# Patient Record
Sex: Female | Born: 1975 | Race: White | Hispanic: Yes | Marital: Married | Smoking: Never smoker
Health system: Southern US, Community
[De-identification: ages and names within clinical notes are randomized; demographics above are authoritative.]

---

## 2005-04-25 ENCOUNTER — Ambulatory Visit: Payer: Self-pay | Admitting: Obstetrics and Gynecology

## 2005-05-21 ENCOUNTER — Emergency Department: Payer: Self-pay | Admitting: Emergency Medicine

## 2005-06-12 ENCOUNTER — Emergency Department: Payer: Self-pay | Admitting: Emergency Medicine

## 2007-02-04 ENCOUNTER — Ambulatory Visit: Payer: Self-pay | Admitting: Family Medicine

## 2007-02-10 ENCOUNTER — Encounter: Payer: Self-pay | Admitting: Family Medicine

## 2008-04-10 ENCOUNTER — Observation Stay: Payer: Self-pay | Admitting: Vascular Surgery

## 2008-10-25 ENCOUNTER — Encounter: Payer: Self-pay | Admitting: Obstetrics and Gynecology

## 2008-11-29 ENCOUNTER — Encounter: Payer: Self-pay | Admitting: Maternal & Fetal Medicine

## 2009-01-06 ENCOUNTER — Encounter: Payer: Self-pay | Admitting: Obstetrics and Gynecology

## 2009-02-28 ENCOUNTER — Encounter: Payer: Self-pay | Admitting: Obstetrics and Gynecology

## 2009-06-18 ENCOUNTER — Emergency Department: Payer: Self-pay | Admitting: Emergency Medicine

## 2009-12-06 ENCOUNTER — Ambulatory Visit: Payer: Self-pay | Admitting: Family Medicine

## 2013-05-12 DIAGNOSIS — K219 Gastro-esophageal reflux disease without esophagitis: Secondary | ICD-10-CM | POA: Insufficient documentation

## 2013-07-04 DIAGNOSIS — J302 Other seasonal allergic rhinitis: Secondary | ICD-10-CM | POA: Insufficient documentation

## 2018-03-10 DIAGNOSIS — Z23 Encounter for immunization: Secondary | ICD-10-CM | POA: Diagnosis not present

## 2018-05-11 DIAGNOSIS — H66001 Acute suppurative otitis media without spontaneous rupture of ear drum, right ear: Secondary | ICD-10-CM | POA: Diagnosis not present

## 2018-05-27 DIAGNOSIS — K649 Unspecified hemorrhoids: Secondary | ICD-10-CM | POA: Diagnosis not present

## 2018-06-30 DIAGNOSIS — M791 Myalgia, unspecified site: Secondary | ICD-10-CM | POA: Diagnosis not present

## 2018-07-09 DIAGNOSIS — R1011 Right upper quadrant pain: Secondary | ICD-10-CM | POA: Diagnosis not present

## 2018-07-09 DIAGNOSIS — Z3200 Encounter for pregnancy test, result unknown: Secondary | ICD-10-CM | POA: Diagnosis not present

## 2018-07-22 ENCOUNTER — Other Ambulatory Visit: Payer: Self-pay | Admitting: Obstetrics and Gynecology

## 2018-07-22 DIAGNOSIS — R1011 Right upper quadrant pain: Secondary | ICD-10-CM

## 2018-07-29 ENCOUNTER — Ambulatory Visit: Payer: 59

## 2018-08-31 DIAGNOSIS — J309 Allergic rhinitis, unspecified: Secondary | ICD-10-CM | POA: Diagnosis not present

## 2018-09-29 DIAGNOSIS — K219 Gastro-esophageal reflux disease without esophagitis: Secondary | ICD-10-CM | POA: Diagnosis not present

## 2018-10-01 DIAGNOSIS — R112 Nausea with vomiting, unspecified: Secondary | ICD-10-CM | POA: Diagnosis not present

## 2018-10-01 DIAGNOSIS — R109 Unspecified abdominal pain: Secondary | ICD-10-CM | POA: Diagnosis not present

## 2018-10-24 DIAGNOSIS — R1032 Left lower quadrant pain: Secondary | ICD-10-CM | POA: Diagnosis not present

## 2018-10-24 DIAGNOSIS — R6883 Chills (without fever): Secondary | ICD-10-CM | POA: Diagnosis not present

## 2018-10-24 DIAGNOSIS — R110 Nausea: Secondary | ICD-10-CM | POA: Diagnosis not present

## 2018-10-24 DIAGNOSIS — R10814 Left lower quadrant abdominal tenderness: Secondary | ICD-10-CM | POA: Diagnosis not present

## 2018-10-24 DIAGNOSIS — K5730 Diverticulosis of large intestine without perforation or abscess without bleeding: Secondary | ICD-10-CM | POA: Diagnosis not present

## 2018-10-25 ENCOUNTER — Encounter: Payer: Self-pay | Admitting: *Deleted

## 2018-11-17 ENCOUNTER — Ambulatory Visit: Payer: 59 | Admitting: Gastroenterology

## 2018-12-27 ENCOUNTER — Other Ambulatory Visit: Payer: Self-pay

## 2018-12-27 ENCOUNTER — Ambulatory Visit: Payer: 59 | Admitting: Gastroenterology

## 2019-02-14 ENCOUNTER — Encounter: Payer: Self-pay | Admitting: Gastroenterology

## 2019-02-14 ENCOUNTER — Ambulatory Visit (INDEPENDENT_AMBULATORY_CARE_PROVIDER_SITE_OTHER): Payer: Self-pay | Admitting: Gastroenterology

## 2019-02-14 ENCOUNTER — Other Ambulatory Visit: Payer: Self-pay

## 2019-02-14 ENCOUNTER — Encounter (INDEPENDENT_AMBULATORY_CARE_PROVIDER_SITE_OTHER): Payer: Self-pay

## 2019-02-14 VITALS — BP 120/80 | HR 70 | Temp 98.7°F | Resp 17 | Wt 112.0 lb

## 2019-02-14 DIAGNOSIS — K5909 Other constipation: Secondary | ICD-10-CM

## 2019-02-14 DIAGNOSIS — R1013 Epigastric pain: Secondary | ICD-10-CM

## 2019-02-14 DIAGNOSIS — G8929 Other chronic pain: Secondary | ICD-10-CM

## 2019-02-14 NOTE — Patient Instructions (Signed)
Dieta rica en fibra High-Fiber Diet La fibra, tambin llamada fibra dietaria, es un tipo de carbohidrato que se encuentra en las frutas, las verduras, los cereales integrales y los frijoles. Una dieta rica en fibra puede tener muchos beneficios para la salud. El mdico puede recomendar una dieta rica en fibra para ayudar a:  Evitar el estreimiento. La fibra puede hacer que defeque con ms frecuencia.  Disminuir el nivel de colesterol.  Aliviar las siguientes afecciones: ? Hinchazn de la venas en el ano (hemorroides). ? Hinchazn e irritacin (inflamacin) de zonas especficas del tracto digestivo (diverticulitis sin complicaciones). ? Un problema del intestino grueso (colon) que, a veces, causa dolor y diarrea (sndrome de colon irritable, SCI).  Evitar comer en exceso como parte de un plan para bajar de peso.  Evitar la enfermedad cardaca, la diabetes tipo 2 y ciertos cnceres. En qu consiste el plan? El consumo diario recomendado de fibra en gramos (g) incluye lo siguiente:  38g para hombres de 50 aos o menos.  30g para los hombres mayores de 50 aos.  25g para mujeres de 50 aos o menos.  21g para las mujeres mayores de 50 aos. Puede recibir la ingesta diaria recomendada de fibra dietaria de la siguiente manera:  Coma una variedad de frutas, verduras, granos y frijoles.  Tome un suplemento de fibra, si no es posible comer suficiente fibra en su dieta. Qu debo saber acerca de la dieta rica en fibra?  Es mejor obtener fibra directamente de los alimentos en lugar de recibirla de suplementos de fibra. No hay demasiada investigacin acerca de qu tan efectivos son los suplementos.  Verifique siempre el contenido de fibra en la etiqueta de informacin nutricional de los alimentos preenvasados. Busque alimentos que contengan 5g o ms de fibra por porcin.  Hable con un especialista en alimentacin y nutricin (nutricionista) si tiene preguntas sobre alimentos  especficos que se recomiendan o no para su afeccin mdica, especialmente si aquellos alimentos no estn detallados a continuacin.  Aumente gradualmente la cantidad de fibra que consume. Si aumenta demasiado rpido el consumo de fibra dietaria puede provocar distensin abdominal, clicos o gases.  Beba abundante agua. El agua ayuda a digerir la fibra. Cules son algunos consejos para seguir este plan?  Consuma una gran variedad de alimentos ricos en fibra.  Asegrese de que al menos la mitad de los granos que ingiere cada da sean cereales integrales.  Coma panes y cereales hechos con harina de cereales integrales en lugar de harina refinada o blanca.  Coma arroz integral, trigo burgol o mijo en lugar de arroz blanco.  Comience el da con un desayuno rico en fibra, como un cereal que contenga al menos 5g de fibra o ms por porcin.  Use frijoles en lugar de carne en las sopas, ensaladas o platos de pastas.  Coma colaciones ricas en fibra, como frutos rojos, verduras crudas, frutos secos y palomitas de maz.  Elija frutas y verduras enteras en lugar de formas procesadas como jugo o salsa. Qu alimentos puedo comer?  Frutas Frutos rojos. Peras. Manzanas. Naranjas. Aguacate. Ciruelas y pasas. Higos secos. Verduras Batatas. Espinaca. Col rizada. Alcachofas. Repollo. Brcoli. Coliflor. Guisantes. Zanahorias. Calabaza. Cereales Panes integrales. Cereal multigrano. Avena. Arroz integral. Cebada. Trigo burgol. Mijo. Quinua. Magdalenas de salvado. Palomitas de maz. Galletas de centeno. Carnes y otras protenas Frijoles blancos, colorados y pintos. Soja. Guisantes secos. Lentejas. Frutos secos y semillas. Lcteos Yogur fortificado con fibra. Bebidas Leche de soja fortificada con fibra. Jugo de naranja fortificado con   fibra. Otros alimentos Barras de fibra. Es posible que los productos mencionados arriba no formen una lista completa de las bebidas y los alimentos recomendados.  Comunquese con un nutricionista para conocer ms opciones. Qu alimentos no se recomiendan? Frutas Jugo de frutas. Frutas cocidas coladas. Verduras Papas fritas. Verduras enlatadas. Verduras muy cocidas. Cereales Pan blanco. Pastas hechas con harina refinada. Arroz blanco. Carnes y otras protenas Cortes de carne con grasa. Pollo o pescado fritos. Lcteos Leche. Yogur. Queso crema. Crema cida. Grasas y aceites Mantequillas. Bebidas Gaseosas. Otros alimentos Tortas y pasteles. Es posible que los productos que se enumeran ms arriba no sean una lista completa de los alimentos y las bebidas que se deben evitar. Comunquese con un nutricionista para obtener ms informacin. Resumen  La fibra es un tipo de carbohidrato. Se encuentra en las frutas, las verduras, los cereales integrales y los frijoles.  Una dieta rica en fibra representa muchos beneficios para la salud, como evitar el estreimiento, bajar el colesterol en la sangre, ayudar a perder peso y reducir el riesgo de enfermedad cardaca, diabetes y ciertos tipos de cncer.  Aumente gradualmente la ingesta de fibra. El aumento demasiado rpido puede provocar clicos, distensin abdominal y gases. Beba mucha agua a la vez que aumenta la fibra.  Las mejores fuentes de fibra incluyen frutas y verduras enteras, granos integrales, frutos secos, semillas y frijoles. Esta informacin no tiene como fin reemplazar el consejo del mdico. Asegrese de hacerle al mdico cualquier pregunta que tenga. Document Released: 05/25/2005 Document Revised: 09/04/2017 Document Reviewed: 05/14/2017 Elsevier Patient Education  2020 Elsevier Inc.  

## 2019-02-14 NOTE — Progress Notes (Signed)
[name] Julitza Rickles, MD [address]  [address]  [place], [place] [id number]  Main: [phone]  Fax: [phone]    Gastroenterology Consultation  Referring Provider:     Marya FossaLeyva, Teresa, PA-C Primary Care Physician:  [facility] Primary Gastroenterologist:  Dr. [name] Andjela Wickes Reason for Consultation:     Epigastric pain, lower abdominal pain, abdominal bloating, weight loss        HPI:   Regina Bean is a 43 y.o. female referred by Dr. Eli Phillipsenter, [facility]  for consultation & management of 6 months history of epigastric pain.  Patient was started on Protonix 40 mg twice daily as well as sucralfate.  [facility], underwent CT abdomen pelvis which was unremarkable.  She also reports lower abdominal pain with abdominal bloating that occurs in the evening after dinner which leads to insomnia.  She lost about 8 pounds in last 6 months.  She reports intact appetite.  She denies nausea, vomiting.  She does report that her bowel movements are associated with significant straining and her stools are soft and she drinks plenty of water.  She denies rectal bleeding.  She does report rectal discomfort for which she was given hemorrhoid cream which helped temporarily. 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  She does not smoke or drink alcohol. Most recent [facility]2018 revealed hemoglobin 12.7, WBC 9.5, platelets 89, FOBT negative, normal CMP  History of acute appendicitis in 2009 status post appendectomy  NSAIDs: None  Antiplts/Anticoagulants/Anti thrombotics: None  GI Procedures: None She reports family history of colitis in her sister who underwent surgery 2 years ago  No past medical history on file.   Current Outpatient Medications:  .  cetirizine (ZYRTEC) 10 MG tablet, Take by mouth., Disp: , Rfl:  .  fluticasone (FLONASE) 50 MCG/ACT nasal spray, 2 sprays by Each Nare route  daily., Disp: , Rfl:  .  pantoprazole (PROTONIX) 20 MG tablet, Take by mouth., Disp: , Rfl:  .  albuterol (VENTOLIN HFA) 108 (90 Base) MCG/ACT inhaler, 2 PUFFS EVERY 4 6 HOURS AS NEEDED FOR WHEEZING. MAY SUBSTITUTE INSURANCE FORMULARY., Disp: , Rfl:  .  dicyclomine (BENTYL) 20 MG tablet, Take 20 mg by mouth 2 (two) times daily. for 10 days, Disp: , Rfl:  .  guaiFENesin (ROBITUSSIN) 100 MG/5ML liquid, Take by mouth., Disp: , Rfl:  .  omeprazole (PRILOSEC) 20 MG capsule, Take by mouth., Disp: , Rfl:  .  predniSONE (DELTASONE) 10 MG tablet, Take by mouth., Disp: , Rfl:  .  ranitidine (ZANTAC) 150 MG tablet, Take by mouth., Disp: , Rfl:  .  sertraline (ZOLOFT) 100 MG tablet, Take by mouth., Disp: , Rfl:  .  sucralfate (CARAFATE) 1 GM/10ML suspension, TAKE 10MLS BY MOUTH 4 TIMES PER DAY, Disp: , Rfl:     No family history on file.   Social History   Tobacco Use  . Smoking status: Never Smoker  . Smokeless tobacco: Never Used  Substance Use Topics  . Alcohol use: Never    Frequency: Never  . Drug use: Never    Allergies as of 02/14/2019  . (No Known Allergies)    Review of Systems:    All systems reviewed and negative except where noted in HPI.   Physical Exam:  BP 120/80 (BP Location: Left Arm, Patient Position: Sitting, Cuff Size: Normal)   Pulse 70  Temp 98.7 F (37.1 C)   Resp 17   Wt 112 lb (50.8 kg)  No LMP recorded.  General:   Alert,  Well-developed, well-nourished, pleasant and cooperative in NAD Head:  Normocephalic and atraumatic. Eyes:  Sclera clear, no icterus.   Conjunctiva pink. Ears:  Normal auditory acuity. Nose:  No deformity, discharge, or lesions. Mouth:  No deformity or lesions,oropharynx pink & moist. Neck:  Supple; no masses or thyromegaly. Lungs:  Respirations even and unlabored.  Clear throughout to auscultation.   No wheezes, crackles, or rhonchi. No acute distress. Heart:  Regular rate and rhythm; no murmurs, clicks, rubs, or gallops. Abdomen:   Normal bowel sounds. Soft, mild epigastric tenderness and non-distended without masses, hepatosplenomegaly or hernias noted.  No guarding or rebound tenderness.   Rectal: Normal perianal exam, nontender rectal exam, palpable external hemorrhoids Msk:  Symmetrical without gross deformities. Good, equal movement & strength bilaterally. Pulses:  Normal pulses noted. Extremities:  No clubbing or edema.  No cyanosis. Neurologic:  Alert and oriented x3;  grossly normal neurologically. Skin:  Intact without significant lesions or rashes. No jaundice. Psych:  Alert and cooperative. Normal mood and affect.  Imaging Studies: Reviewed  Assessment and Plan:   Regina Bean is a 43 y.o. Spanish-speaking female with no significant past medical history is seen in consultation for chronic epigastric pain, chronic lower abdominal pain associated with abdominal bloating and constipation, unintentional weight loss.  CT abdomen and pelvis, labs unremarkable, FOBT negative in 10/2018  Chronic epigastric pain and weight loss Continue Protonix 40 mg twice daily for now Check H. pylori IgG and treat empirically for H. pylori infection if positive given she is symptomatic  Chronic lower abdominal pain associated with abdominal bloating and hard stools Discussed with her about management of constipation with high-fiber diet, fiber supplements and stool softeners if needed  Also, discussed with her about endoscopic evaluation but patient does not have insurance and she prefers less invasive tests at this time   Follow up in 1 month   Cephas Darby, MD

## 2019-02-15 LAB — H. PYLORI ANTIBODY, IGG: H. pylori, IgG AbS: 0.2 Index Value (ref 0.00–0.79)

## 2019-03-16 ENCOUNTER — Encounter (INDEPENDENT_AMBULATORY_CARE_PROVIDER_SITE_OTHER): Payer: Self-pay

## 2019-03-16 ENCOUNTER — Encounter: Payer: Self-pay | Admitting: Gastroenterology

## 2019-03-16 ENCOUNTER — Ambulatory Visit: Payer: Self-pay | Admitting: Gastroenterology

## 2019-03-16 ENCOUNTER — Other Ambulatory Visit: Payer: Self-pay

## 2019-03-16 VITALS — BP 121/80 | HR 79 | Temp 98.3°F | Wt 116.0 lb

## 2019-03-16 DIAGNOSIS — R1013 Epigastric pain: Secondary | ICD-10-CM

## 2019-03-16 DIAGNOSIS — K5909 Other constipation: Secondary | ICD-10-CM

## 2019-03-16 DIAGNOSIS — G8929 Other chronic pain: Secondary | ICD-10-CM

## 2019-03-16 DIAGNOSIS — E739 Lactose intolerance, unspecified: Secondary | ICD-10-CM

## 2019-03-16 NOTE — Progress Notes (Signed)
Cephas Darby, MD [address]  [place], [place] [address]  Main: [phone]  Fax: [phone]    Gastroenterology Consultation  Referring Provider:     [facility], [place] Physician:  [facility], [place] Primary Gastroenterologist:  Dr. Cephas Darby Reason for Consultation:     Epigastric pain, lower abdominal pain, abdominal bloating, weight loss        HPI:   Regina Bean is a 43 y.o. female referred by Dr. Domingo Madeira, [place]  for consultation & management of 6 months history of epigastric pain.  Patient was started on Protonix 40 mg twice daily as well as sucralfate.  She went to [facility], underwent CT abdomen pelvis which was unremarkable.  She also reports lower abdominal pain with abdominal bloating that occurs in the evening after dinner which leads to insomnia.  She lost about 8 pounds in last 6 months.  She reports intact appetite.  She denies nausea, vomiting.  She does report that her bowel movements are associated with significant straining and her stools are soft and she drinks plenty of water.  She denies rectal bleeding.  She does report rectal discomfort for which she was given hemorrhoid cream which helped temporarily. 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  She does not smoke or drink alcohol. Most recent labs from [facility] in 10/2018 revealed hemoglobin 12.7, WBC 9.5, platelets 89, FOBT negative, normal CMP  History of acute appendicitis in 2009 status post appendectomy  Follow-up visit 03/16/2019 Her constipation has resolved.  She completely eliminated tortillas, consuming more fruits and vegetables along with chewable fiber supplements daily.  Currently, her bowel movements are brown and sausage shaped.  She denies any rectal bleeding.  Her epigastric pain responded well to Protonix 40 mg twice daily.  Patient reports that she had rice milk about 2 weeks  ago that has resulted in significant abdominal bloating and gas pain.  She says she never had milk before.  She is taking sucralfate.  Her symptoms are significantly improved, however has mild lower abdominal discomfort.  She also reports mild rectal burning pain.  She has not tried any over-the-counter hemorrhoidal cream. She gained about 4 pounds since last visit   NSAIDs: None  Antiplts/Anticoagulants/Anti thrombotics: None  GI Procedures: None She reports family history of colitis in her sister who underwent surgery 2 years ago  History reviewed. No pertinent past medical history.   Current Outpatient Medications:  .  cetirizine (ZYRTEC) 10 MG tablet, Take by mouth., Disp: , Rfl:  .  pantoprazole (PROTONIX) 20 MG tablet, Take by mouth., Disp: , Rfl:  .  sucralfate (CARAFATE) 1 g tablet, Take 1 g by mouth 4 (four) times daily -  with meals and at bedtime., Disp: , Rfl:  .  fluticasone (FLONASE) 50 MCG/ACT nasal spray, 2 sprays by Each Nare route daily., Disp: , Rfl:     History reviewed. No pertinent family history.   Social History   Tobacco Use  . Smoking status: Never Smoker  . Smokeless tobacco: Never Used  Substance Use Topics  . Alcohol use: Never    Frequency: Never  . Drug use: Never    Allergies as of 03/16/2019  . (No Known Allergies)    Review of Systems:    All systems reviewed and negative except where noted in HPI.   Physical Exam:  BP 121/80 (BP  Location: Left Arm, Patient Position: Sitting, Cuff Size: Normal)   Pulse 79   Temp 98.3 F (36.8 C) (Oral)   Wt 116 lb (52.6 kg)  No LMP recorded.  General:   Alert,  Well-developed, well-nourished, pleasant and cooperative in NAD Head:  Normocephalic and atraumatic. Eyes:  Sclera clear, no icterus.   Conjunctiva pink. Ears:  Normal auditory acuity. Nose:  No deformity, discharge, or lesions. Mouth:  No deformity or lesions,oropharynx pink & moist. Neck:  Supple; no masses or thyromegaly. Lungs:   Respirations even and unlabored.  Clear throughout to auscultation.   No wheezes, crackles, or rhonchi. No acute distress. Heart:  Regular rate and rhythm; no murmurs, clicks, rubs, or gallops. Abdomen:  Normal bowel sounds. Soft, mild epigastric tenderness and non-distended without masses, hepatosplenomegaly or hernias noted.  No guarding or rebound tenderness.   Rectal: Normal perianal exam, nontender rectal exam, palpable external hemorrhoids Msk:  Symmetrical without gross deformities. Good, equal movement & strength bilaterally. Pulses:  Normal pulses noted. Extremities:  No clubbing or edema.  No cyanosis. Neurologic:  Alert and oriented x3;  grossly normal neurologically. Skin:  Intact without significant lesions or rashes. No jaundice. Psych:  Alert and cooperative. Normal mood and affect.  Imaging Studies: Reviewed  Assessment and Plan:   Berline Anabel Lykins is a 43 y.o. Spanish-speaking female with no significant past medical history is seen for follow-up of chronic epigastric pain, chronic constipation CT abdomen and pelvis, labs unremarkable, FOBT negative in 10/2018  Chronic epigastric pain: Currently resolved H. pylori IgG was negative  Decrease Protonix to 40 mg daily for about 2 to 3 weeks, then stop   Chronic constipation: Resolved  Abdominal bloating after rice milk Patient probably has lactose intolerance, avoid dairy   Follow up as needed   Arlyss Repress, MD

## 2019-03-31 ENCOUNTER — Telehealth: Payer: Self-pay | Admitting: Gastroenterology

## 2019-03-31 NOTE — Telephone Encounter (Signed)
Pt called wanting an apt today for severe stomach pain.  I called pt back with interpreter letting her know we did not have a doctor in the office today . She will go see her PCP she thinks it may be her ulcer and she needed some medication for it. I informed pt to call our office to schedule a f/u apt if she needed too.

## 2019-09-20 ENCOUNTER — Other Ambulatory Visit: Payer: Self-pay | Admitting: Obstetrics and Gynecology

## 2019-09-20 DIAGNOSIS — R1011 Right upper quadrant pain: Secondary | ICD-10-CM

## 2020-03-22 ENCOUNTER — Ambulatory Visit: Payer: Self-pay | Attending: Internal Medicine

## 2020-03-22 ENCOUNTER — Other Ambulatory Visit: Payer: Self-pay

## 2020-03-22 DIAGNOSIS — Z23 Encounter for immunization: Secondary | ICD-10-CM

## 2020-03-22 NOTE — Progress Notes (Signed)
   Covid-19 Vaccination Clinic  Name:  Regina Bean    MRN: [id number] DOB: 13-Jan-1976  03/22/2020  Ms. Regina Bean was observed post Covid-19 immunization for 15 minutes without incident. She was provided with Vaccine Information Sheet and instruction to access the V-Safe system.   Ms. Regina Bean was instructed to call 911 with any severe reactions post vaccine: Marland Kitchen Difficulty breathing  . Swelling of face and throat  . A fast heartbeat  . A bad rash all over body  . Dizziness and weakness

## 2020-06-18 ENCOUNTER — Other Ambulatory Visit: Payer: Self-pay

## 2020-06-18 DIAGNOSIS — Z20822 Contact with and (suspected) exposure to covid-19: Secondary | ICD-10-CM

## 2020-06-21 LAB — NOVEL CORONAVIRUS, NAA: SARS-CoV-2, NAA: NOT DETECTED

## 2021-03-17 ENCOUNTER — Emergency Department
Admission: EM | Admit: 2021-03-17 | Discharge: 2021-03-17 | Disposition: A | Discharge status: Home / Self Care | Payer: No Typology Code available for payment source | Attending: Emergency Medicine | Admitting: Emergency Medicine

## 2021-03-17 ENCOUNTER — Emergency Department: Payer: No Typology Code available for payment source

## 2021-03-17 ENCOUNTER — Other Ambulatory Visit: Payer: Self-pay

## 2021-03-17 DIAGNOSIS — S199XXA Unspecified injury of neck, initial encounter: Secondary | ICD-10-CM | POA: Diagnosis present

## 2021-03-17 DIAGNOSIS — M7918 Myalgia, other site: Secondary | ICD-10-CM

## 2021-03-17 DIAGNOSIS — M25512 Pain in left shoulder: Secondary | ICD-10-CM | POA: Insufficient documentation

## 2021-03-17 DIAGNOSIS — R079 Chest pain, unspecified: Secondary | ICD-10-CM | POA: Insufficient documentation

## 2021-03-17 DIAGNOSIS — M549 Dorsalgia, unspecified: Secondary | ICD-10-CM | POA: Insufficient documentation

## 2021-03-17 DIAGNOSIS — S161XXA Strain of muscle, fascia and tendon at neck level, initial encounter: Secondary | ICD-10-CM | POA: Diagnosis not present

## 2021-03-17 MED ORDER — OXYCODONE-ACETAMINOPHEN 5-325 MG PO TABS
1.0000 | ORAL_TABLET | Freq: Once | ORAL | Status: AC
  Administered 2021-03-17: 1 via ORAL
  Filled 2021-03-17: qty 1

## 2021-03-17 MED ORDER — NAPROXEN 500 MG PO TABS: 500.0000 mg | ORAL_TABLET | Freq: Two times a day (BID) | ORAL | Status: DC

## 2021-03-17 MED ORDER — ORPHENADRINE CITRATE ER 100 MG PO TB12: 100.0000 mg | ORAL_TABLET | Freq: Two times a day (BID) | ORAL | 0 refills | Status: DC

## 2021-03-17 MED ORDER — CYCLOBENZAPRINE HCL 10 MG PO TABS
10.0000 mg | ORAL_TABLET | Freq: Once | ORAL | Status: AC
  Administered 2021-03-17: 10 mg via ORAL
  Filled 2021-03-17: qty 1
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
## 2021-03-17 NOTE — Discharge Instructions (Signed)
No acute findings on CT of the cervical spine.  No acute findings on x-ray of the chest.  Read and follow discharge care instruction.  Take medication as directed.

## 2021-11-05 ENCOUNTER — Emergency Department
Admission: EM | Admit: 2021-11-05 | Discharge: 2021-11-05 | Disposition: A | Discharge status: Home / Self Care | Payer: BC Managed Care – PPO | Attending: Emergency Medicine | Admitting: Emergency Medicine

## 2021-11-05 ENCOUNTER — Other Ambulatory Visit: Payer: Self-pay

## 2021-11-05 ENCOUNTER — Encounter: Payer: Self-pay | Admitting: *Deleted

## 2021-11-05 ENCOUNTER — Emergency Department: Payer: BC Managed Care – PPO

## 2021-11-05 DIAGNOSIS — R1084 Generalized abdominal pain: Secondary | ICD-10-CM | POA: Insufficient documentation

## 2021-11-05 DIAGNOSIS — A080 Rotaviral enteritis: Secondary | ICD-10-CM | POA: Diagnosis not present

## 2021-11-05 DIAGNOSIS — Z20822 Contact with and (suspected) exposure to covid-19: Secondary | ICD-10-CM | POA: Insufficient documentation

## 2021-11-05 DIAGNOSIS — R197 Diarrhea, unspecified: Secondary | ICD-10-CM | POA: Diagnosis present

## 2021-11-05 LAB — URINALYSIS, ROUTINE W REFLEX MICROSCOPIC
Bacteria, UA: NONE SEEN
Bilirubin Urine: NEGATIVE
Glucose, UA: NEGATIVE mg/dL
Ketones, ur: NEGATIVE mg/dL
Leukocytes,Ua: NEGATIVE
Nitrite: NEGATIVE
Protein, ur: NEGATIVE mg/dL
Specific Gravity, Urine: 1.006 (ref 1.005–1.030)
pH: 6 (ref 5.0–8.0)

## 2021-11-05 LAB — COMPREHENSIVE METABOLIC PANEL
ALT: 23 U/L (ref 0–44)
AST: 29 U/L (ref 15–41)
Albumin: 4.4 g/dL (ref 3.5–5.0)
Alkaline Phosphatase: 84 U/L (ref 38–126)
Anion gap: 9 (ref 5–15)
BUN: 10 mg/dL (ref 6–20)
CO2: 19 mmol/L — ABNORMAL LOW (ref 22–32)
Calcium: 8.9 mg/dL (ref 8.9–10.3)
Chloride: 108 mmol/L (ref 98–111)
Creatinine, Ser: 0.46 mg/dL (ref 0.44–1.00)
GFR, Estimated: >60 mL/min (ref >60)
Glucose, Bld: 131 mg/dL — ABNORMAL HIGH (ref 70–99)
Potassium: 3.7 mmol/L (ref 3.5–5.1)
Sodium: 136 mmol/L (ref 135–145)
Total Bilirubin: 0.5 mg/dL (ref 0.3–1.2)
Total Protein: 8 g/dL (ref 6.5–8.1)

## 2021-11-05 LAB — CBC
HCT: 40.1 % (ref 36.0–46.0)
Hemoglobin: 13.3 g/dL (ref 12.0–15.0)
MCH: 29 pg (ref 26.0–34.0)
MCHC: 33.2 g/dL (ref 30.0–36.0)
MCV: 87.6 fL (ref 80.0–100.0)
Platelets: 339 10*3/uL (ref 150–400)
RBC: 4.58 MIL/uL (ref 3.87–5.11)
RDW: 13 % (ref 11.5–15.5)
WBC: 12.6 10*3/uL — ABNORMAL HIGH (ref 4.0–10.5)
nRBC: 0 % (ref 0.0–0.2)

## 2021-11-05 LAB — GASTROINTESTINAL PANEL BY PCR, STOOL (REPLACES STOOL CULTURE)

## 2021-11-05 LAB — C DIFFICILE QUICK SCREEN W PCR REFLEX
C Diff antigen: NEGATIVE
C Diff interpretation: NOT DETECTED
C Diff toxin: NEGATIVE

## 2021-11-05 LAB — POC URINE PREG, ED: Preg Test, Ur: NEGATIVE

## 2021-11-05 LAB — SARS CORONAVIRUS 2 BY RT PCR: SARS Coronavirus 2 by RT PCR: NEGATIVE

## 2021-11-05 LAB — LIPASE, BLOOD: Lipase: 33 U/L (ref 11–51)

## 2021-11-05 MED ORDER — ONDANSETRON HCL 4 MG PO TABS: 4.0000 mg | ORAL_TABLET | Freq: Three times a day (TID) | ORAL | 0 refills | Status: DC | PRN

## 2021-11-05 MED ORDER — SODIUM CHLORIDE 0.9 % IV BOLUS
1000.0000 mL | Freq: Once | INTRAVENOUS | Status: AC
  Administered 2021-11-05: 1000 mL via INTRAVENOUS

## 2021-11-05 MED ORDER — IOHEXOL 300 MG/ML  SOLN
100.0000 mL | Freq: Once | INTRAMUSCULAR | Status: AC | PRN
  Administered 2021-11-05: 100 mL via INTRAVENOUS

## 2021-11-05 MED ORDER — ONDANSETRON HCL 4 MG/2ML IJ SOLN
4.0000 mg | Freq: Once | INTRAMUSCULAR | Status: AC
  Administered 2021-11-05: 4 mg via INTRAVENOUS
  Filled 2021-11-05: qty 2

## 2021-11-05 NOTE — ED Notes (Signed)
Pt transported to CT ?

## 2021-11-05 NOTE — ED Triage Notes (Signed)
Pt has abd pain with diarrhea.  Pt states diarrhea x 3 per hour per pt.  Interpreter on a stick used in triage.  No vag bleeding.  No vomiting.   No urinary sx.  Pt alert.

## 2021-11-05 NOTE — ED Notes (Signed)
Poct pregnancy Negative 

## 2021-11-05 NOTE — ED Notes (Signed)
D/c completed with interpreter - no questions or concerns noted

## 2021-11-05 NOTE — ED Provider Notes (Signed)
[facility] Provider Note    Event Date/Time   First MD Initiated Contact with Patient 11/05/21 1623     (approximate)   History   Abdominal Pain  Hospital interpreter utilized for H and P HPI  Regina Bean is a 46 y.o. female  who presents to the emergency department today because of concern for diarrhea. The patient's symptoms started this morning. Denies any unusual ingestions or known sick contacts yesterday. Has had multiple episodes of diarrhea per hour. Non bloody. Has had associated generalized abdominal pain and nausea and vomiting. States history of acid reflux and appendectomy.        Physical Exam   Triage Vital Signs: ED Triage Vitals  Enc Vitals Group     BP 11/05/21 1522 (!) 143/92     Pulse Rate 11/05/21 1522 (!) 106     Resp 11/05/21 1522 18     Temp 11/05/21 1522 99 F (37.2 C)     Temp Source 11/05/21 1522 Oral     SpO2 11/05/21 1522 98 %     Weight 11/05/21 1523 138 lb (62.6 kg)     Height 11/05/21 1523 5\' 2"  (1.575 m)     Head Circumference --      Peak Flow --      Pain Score 11/05/21 1523 10     Pain Loc --      Pain Edu? --      Excl. in GC? --     Most recent vital signs: Vitals:   11/05/21 1522  BP: (!) 143/92  Pulse: (!) 106  Resp: 18  Temp: 99 F (37.2 C)  SpO2: 98%    General: Awake, alert, oriented. CV:  Good peripheral perfusion. Regular rate and rhythm. No m/r/g Resp:  Normal effort. Lungs clear. Abd:  No distention. Diffusely tender to palpation.   ED Results / Procedures / Treatments   Labs (all labs ordered are listed, but only abnormal results are displayed) Labs Reviewed  GASTROINTESTINAL PANEL BY PCR, STOOL (REPLACES STOOL CULTURE) - Abnormal; Notable for the following components:      Result Value   Rotavirus A DETECTED (*)    All other components within normal limits  COMPREHENSIVE METABOLIC PANEL - Abnormal; Notable for the following components:   CO2 19 (*)    Glucose,  Bld 131 (*)    All other components within normal limits  CBC - Abnormal; Notable for the following components:   WBC 12.6 (*)    All other components within normal limits  URINALYSIS, ROUTINE W REFLEX MICROSCOPIC - Abnormal; Notable for the following components:   Color, Urine STRAW (*)    APPearance CLEAR (*)    Hgb urine dipstick MODERATE (*)    All other components within normal limits  SARS CORONAVIRUS 2 BY RT PCR  C DIFFICILE QUICK SCREEN W PCR REFLEX    LIPASE, BLOOD  POC URINE PREG, ED     EKG  None   RADIOLOGY I independently interpreted and visualized the CT abd/pel. My interpretation: No free air Radiology interpretation:  IMPRESSION:  Negative CT abdomen/pelvis.  Prior appendectomy.        PROCEDURES:  Critical Care performed: No  Procedures   MEDICATIONS ORDERED IN ED: Medications - No data to display   IMPRESSION / MDM / ASSESSMENT AND PLAN / ED COURSE  I reviewed the triage vital signs and the nursing notes.  Differential diagnosis includes, but is not limited to, gallbladder disease, gastroenteritis, colitis, diverticulitis, mesenteric ischemia, perforation.    Patient's presentation is most consistent with acute presentation with potential threat to life or bodily function.  Patient presented to the emergency department today because of concerns for diarrhea associated with abdominal pain and some nausea.  On exam patient has diffuse tenderness to the abdomen.  Blood work without any concerning leukocytosis or LFT abnormality.  Did obtain a CT scan which did not show any free air concerning inflammation.  Stool panel was positive for rotavirus.  I discussed this with the patient.  Discussed plan of care.  At this time do not feel patient warrants inpatient admission.  FINAL CLINICAL IMPRESSION(S) / ED DIAGNOSES   Final diagnoses:  Enteritis due to Rotavirus     Note:  This document was prepared using Dragon  voice recognition software and may include unintentional dictation errors.    Phineas Semen, MD 11/05/21 [phone]

## 2023-02-02 DIAGNOSIS — T24231D Burn of second degree of right lower leg, subsequent encounter: Secondary | ICD-10-CM | POA: Diagnosis not present

## 2023-02-02 DIAGNOSIS — L910 Hypertrophic scar: Secondary | ICD-10-CM | POA: Diagnosis not present

## 2023-02-02 DIAGNOSIS — L299 Pruritus, unspecified: Secondary | ICD-10-CM | POA: Diagnosis not present

## 2023-02-02 DIAGNOSIS — R600 Localized edema: Secondary | ICD-10-CM | POA: Diagnosis not present

## 2023-02-02 DIAGNOSIS — T25211D Burn of second degree of right ankle, subsequent encounter: Secondary | ICD-10-CM | POA: Diagnosis not present

## 2023-02-02 DIAGNOSIS — M792 Neuralgia and neuritis, unspecified: Secondary | ICD-10-CM | POA: Diagnosis not present

## 2023-02-02 DIAGNOSIS — T25311S Burn of third degree of right ankle, sequela: Secondary | ICD-10-CM | POA: Diagnosis not present

## 2023-02-02 DIAGNOSIS — T24331D Burn of third degree of right lower leg, subsequent encounter: Secondary | ICD-10-CM | POA: Diagnosis not present

## 2023-02-02 DIAGNOSIS — T25321S Burn of third degree of right foot, sequela: Secondary | ICD-10-CM | POA: Diagnosis not present

## 2023-02-02 DIAGNOSIS — X04XXXS Exposure to ignition of highly flammable material, sequela: Secondary | ICD-10-CM | POA: Diagnosis not present

## 2023-02-02 DIAGNOSIS — T25221D Burn of second degree of right foot, subsequent encounter: Secondary | ICD-10-CM | POA: Diagnosis not present

## 2023-02-02 DIAGNOSIS — T24331S Burn of third degree of right lower leg, sequela: Secondary | ICD-10-CM | POA: Diagnosis not present

## 2023-02-17 ENCOUNTER — Other Ambulatory Visit: Payer: Self-pay | Admitting: Physician Assistant

## 2023-02-17 DIAGNOSIS — Z1231 Encounter for screening mammogram for malignant neoplasm of breast: Secondary | ICD-10-CM

## 2023-05-13 ENCOUNTER — Ambulatory Visit
Admission: RE | Admit: 2023-05-13 | Discharge: 2023-05-13 | Disposition: A | Discharge status: Home / Self Care | Payer: BC Managed Care – PPO | Source: Ambulatory Visit | Attending: Physician Assistant | Admitting: Physician Assistant

## 2023-05-13 DIAGNOSIS — Z1231 Encounter for screening mammogram for malignant neoplasm of breast: Secondary | ICD-10-CM | POA: Insufficient documentation

## 2023-09-03 IMAGING — CT CT ABD-PELV W/ CM
2 of 5 series · 15 of 46 positions shown, 17 images · IV contrast (APPLIED)
Comparison: 04/10/2008

CLINICAL DATA: Abdominal pain, diarrhea

EXAM:
CT ABDOMEN AND PELVIS WITH CONTRAST
TECHNIQUE: Multidetector CT imaging of the abdomen and pelvis was performed
using the standard protocol following bolus administration of
intravenous contrast.

[Series 2: routine abd/pel with · axial · 0.79mm/px · z∈[-712,-257]mm · 12 of 101 slices shown, 14 images]
[im 5/101  soft-tissue]
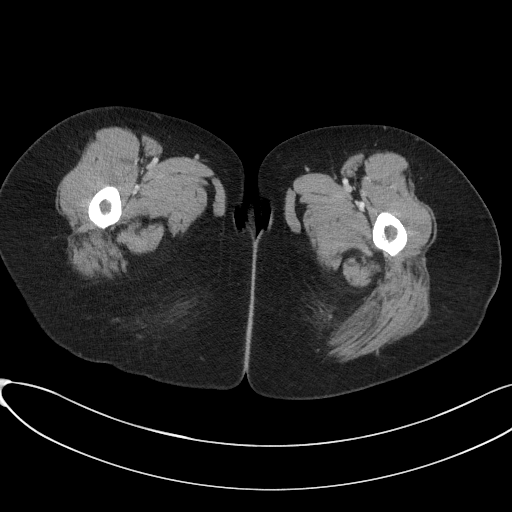
[im 5/101  bone]
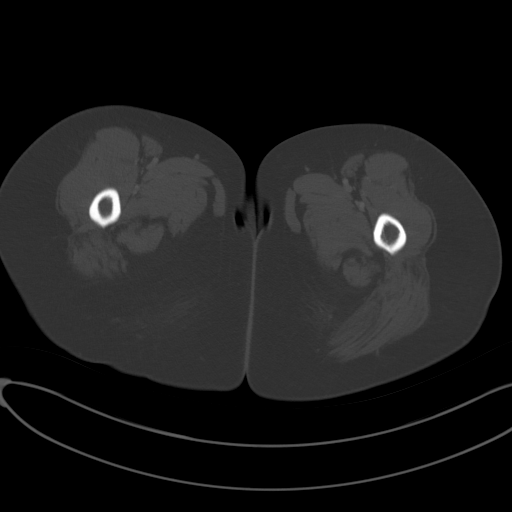
[im 15/101  soft-tissue]
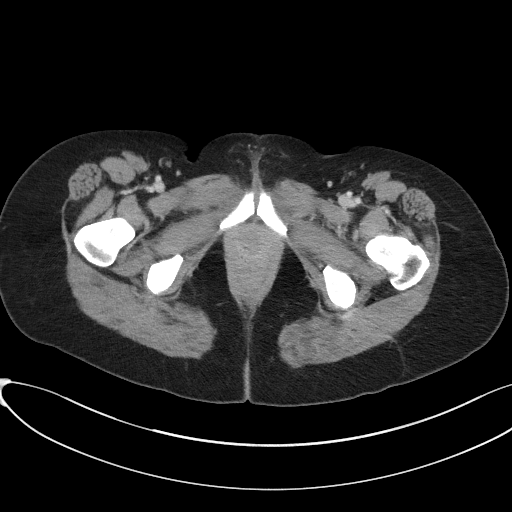
[im 24/101  soft-tissue]
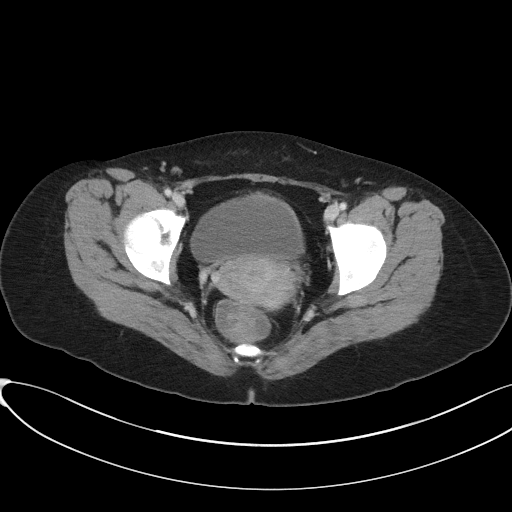
[im 29/101  soft-tissue]
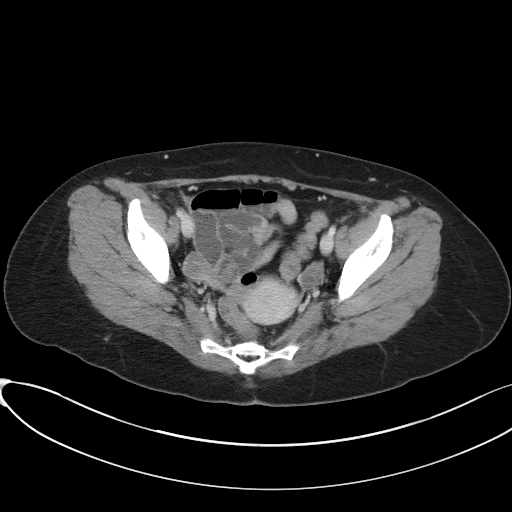
[im 39/101  soft-tissue]
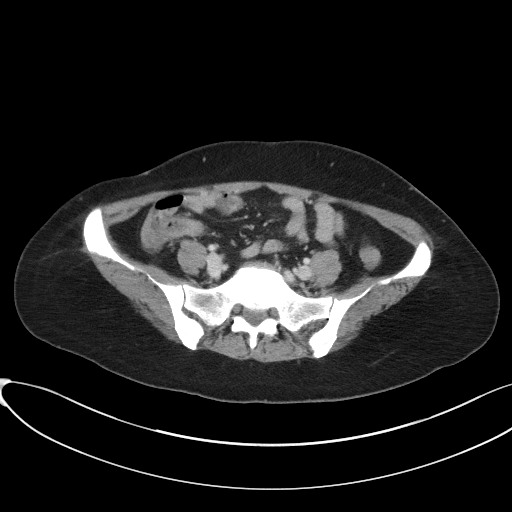
[im 48/101  soft-tissue]
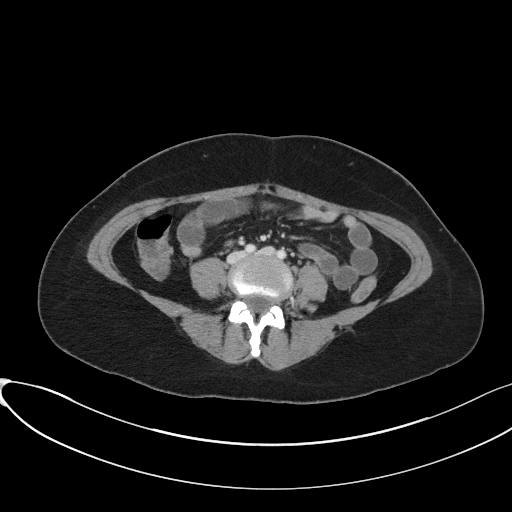
[im 53/101  soft-tissue]
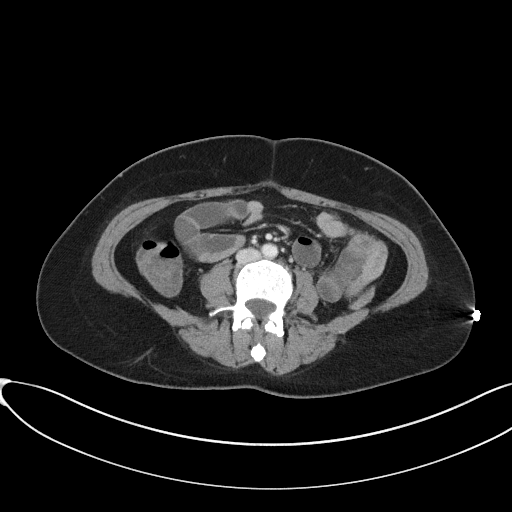
[im 62/101  soft-tissue]
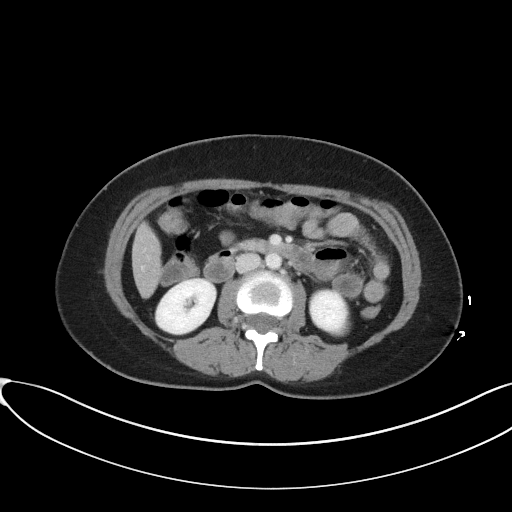
[im 72/101  soft-tissue]
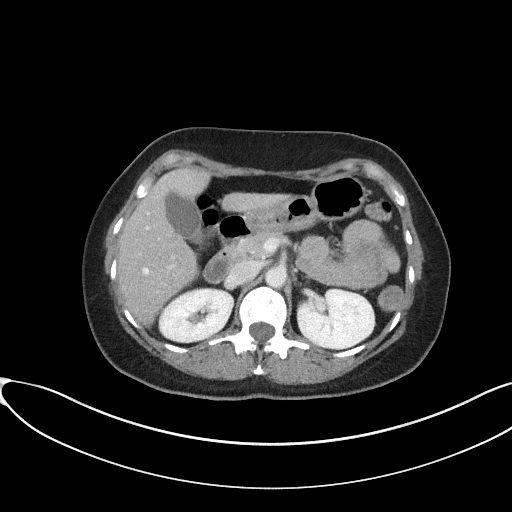
[im 72/101  bone]
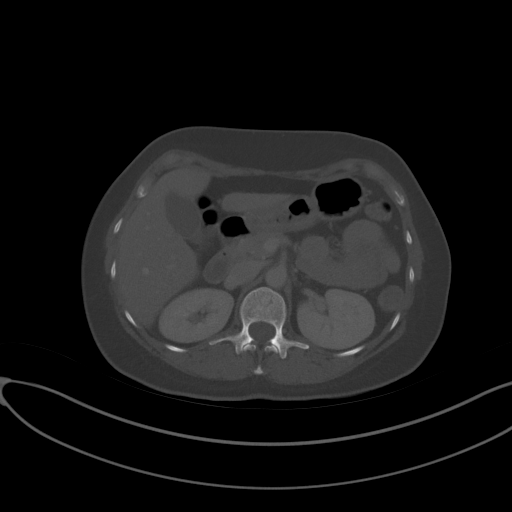
[im 77/101  soft-tissue]
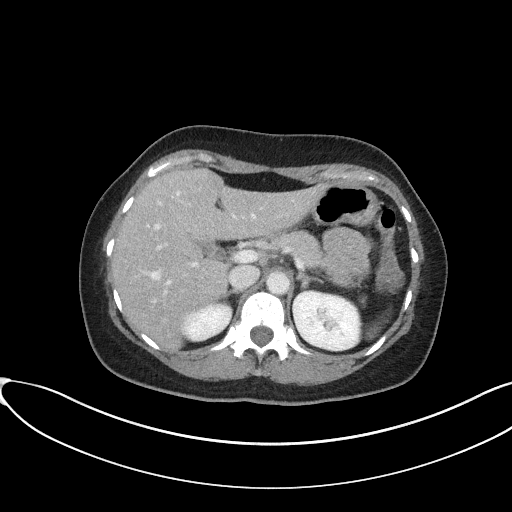
[im 86/101  soft-tissue]
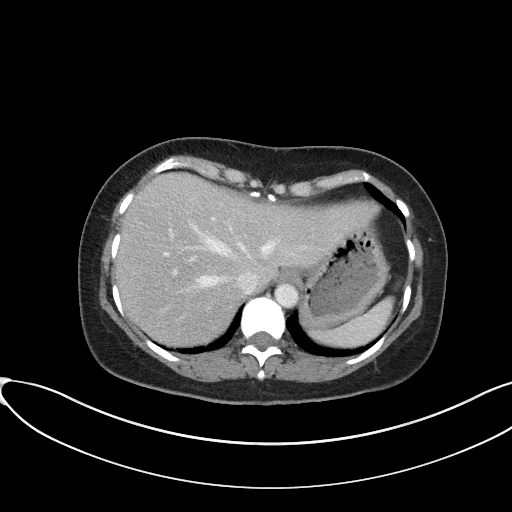
[im 96/101  soft-tissue]
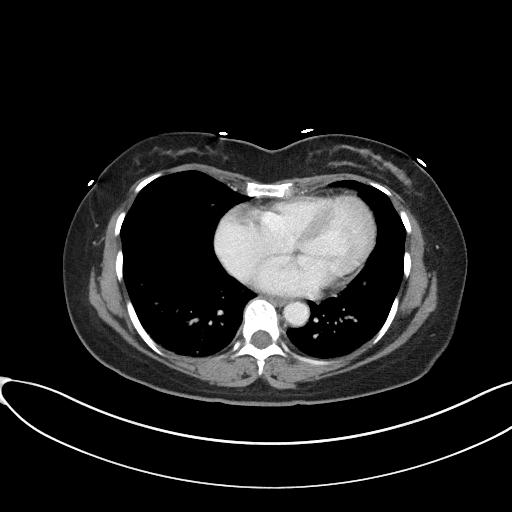

[Series 5: coronal st · coronal · 0.68mm/px · 3 of 60 slices shown]
[im 20/60  soft-tissue]
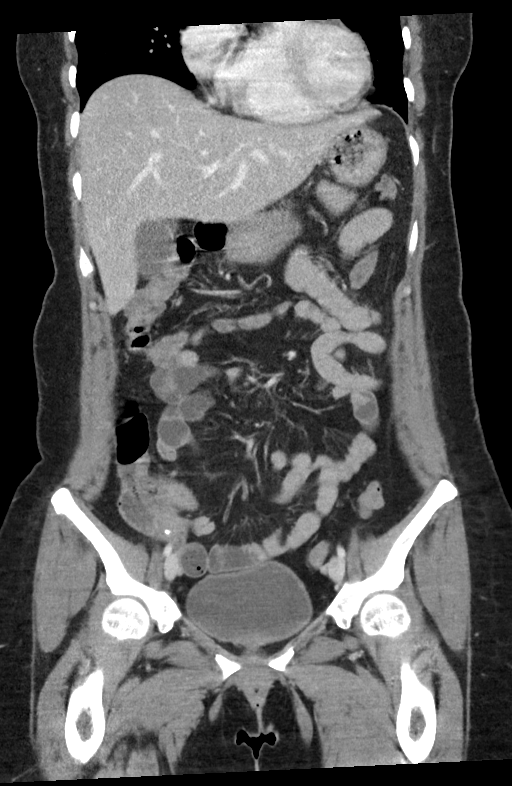
[im 27/60  soft-tissue]
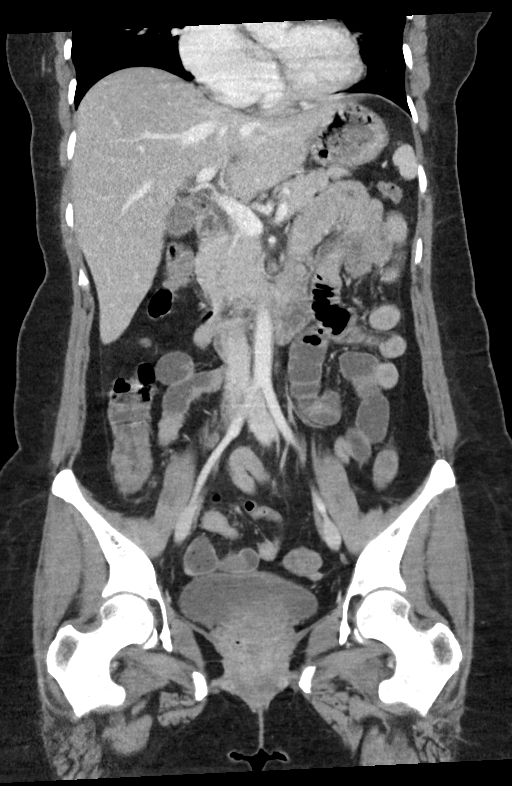
[im 33/60  soft-tissue]
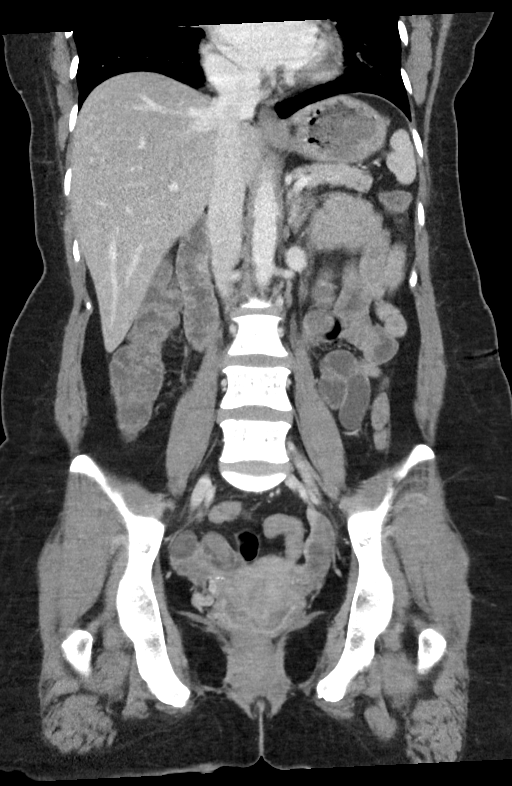

[15 of 46 positions shown; findings below may reference images not displayed]

RADIATION DOSE REDUCTION: This exam was performed according to the
departmental dose-optimization program which includes automated
exposure control, adjustment of the mA and/or kV according to
patient size and/or use of iterative reconstruction technique.

CONTRAST:  100mL OMNIPAQUE IOHEXOL 300 MG/ML  SOLN
FINDINGS: Lower chest: Mild bibasilar atelectasis.

Hepatobiliary: Liver is within normal limits.

Gallbladder is unremarkable. No intrahepatic or extrahepatic duct
dilatation.

Pancreas: Within normal limits.

Spleen: Within normal limits

Adrenals/Urinary Tract: Adrenal glands are within normal limits.

Kidneys are within normal limits.  No hydronephrosis.

Mildly thick-walled bladder, although underdistended.

Stomach/Bowel: Stomach is within normal limits.

No evidence of bowel obstruction.

Prior appendectomy.

No colonic wall thickening or inflammatory changes.

Vascular/Lymphatic: No evidence of abdominal aortic aneurysm.

No suspicious abdominopelvic lymphadenopathy.

Reproductive: Retroverted uterus.

Bilateral ovaries are within normal limits.

Other: No abdominopelvic ascites.

Musculoskeletal: Visualized osseous structures are within normal
limits.
IMPRESSION: Negative CT abdomen/pelvis.  Prior appendectomy.

## 2024-05-12 ENCOUNTER — Ambulatory Visit
Admission: RE | Admit: 2024-05-12 | Discharge: 2024-05-12 | Disposition: A | Discharge status: Home / Self Care | Attending: Gastroenterology | Admitting: Gastroenterology

## 2024-05-12 ENCOUNTER — Ambulatory Visit: Admitting: Anesthesiology

## 2024-05-12 ENCOUNTER — Encounter: Payer: Self-pay | Admitting: Gastroenterology

## 2024-05-12 ENCOUNTER — Encounter: Admission: RE | Disposition: A | Payer: Self-pay | Source: Home / Self Care | Attending: Gastroenterology

## 2024-05-12 HISTORY — PX: COLONOSCOPY: SHX5424

## 2024-05-12 HISTORY — PX: POLYPECTOMY: SHX149

## 2024-05-12 HISTORY — PX: ESOPHAGOGASTRODUODENOSCOPY: SHX5428

## 2024-05-12 LAB — POCT PREGNANCY, URINE: Preg Test, Ur: NEGATIVE

## 2024-05-12 SURGERY — COLONOSCOPY
Anesthesia: General

## 2024-05-12 MED ORDER — SIMETHICONE 40 MG/0.6ML PO SUSP
ORAL | Status: DC | PRN
  Administered 2024-05-12 (×2): 60 mL

## 2024-05-12 MED ORDER — GLYCOPYRROLATE 0.2 MG/ML IJ SOLN
INTRAMUSCULAR | Status: DC | PRN
  Administered 2024-05-12 (×2): .1 mg via INTRAVENOUS

## 2024-05-12 MED ORDER — PROPOFOL 500 MG/50ML IV EMUL
INTRAVENOUS | Status: DC | PRN
  Administered 2024-05-12: 140 ug/kg/min via INTRAVENOUS
  Administered 2024-05-12: 40 mg via INTRAVENOUS
  Administered 2024-05-12: 100 mg via INTRAVENOUS

## 2024-05-12 MED ORDER — GLYCOPYRROLATE 0.2 MG/ML IJ SOLN
INTRAMUSCULAR | Status: AC
  Filled 2024-05-12: qty 1

## 2024-05-12 MED ORDER — LIDOCAINE HCL (PF) 2 % IJ SOLN
INTRAMUSCULAR | Status: AC
  Filled 2024-05-12: qty 5

## 2024-05-12 MED ORDER — SODIUM CHLORIDE 0.9 % IV SOLN
INTRAVENOUS | Status: DC
  Administered 2024-05-12: 20 mL/h via INTRAVENOUS

## 2024-05-12 MED ORDER — LIDOCAINE HCL (CARDIAC) PF 100 MG/5ML IV SOSY
PREFILLED_SYRINGE | INTRAVENOUS | Status: DC | PRN
  Administered 2024-05-12: 100 mg via INTRAVENOUS

## 2024-05-12 NOTE — Anesthesia Preprocedure Evaluation (Signed)
 Anesthesia Evaluation  Patient identified by MRN, date of birth, ID band Patient awake    Reviewed: Allergy & Precautions, H&P , NPO status , Patient's Chart, lab work & pertinent test results, reviewed documented beta blocker date and time   History of Anesthesia Complications Negative for: history of anesthetic complications  Airway Mallampati: III  TM Distance: >3 FB Neck ROM: full    Dental  (+) Dental Advidsory Given, Teeth Intact   Pulmonary neg shortness of breath, asthma (allergen induced) , neg COPD, Recent URI , Resolved   Pulmonary exam normal breath sounds clear to auscultation       Cardiovascular Exercise Tolerance: Good negative cardio ROS Normal cardiovascular exam Rhythm:regular Rate:Normal     Neuro/Psych negative neurological ROS  negative psych ROS   GI/Hepatic Neg liver ROS,GERD  ,,  Endo/Other  negative endocrine ROS    Renal/GU negative Renal ROS  negative genitourinary   Musculoskeletal   Abdominal   Peds  Hematology negative hematology ROS (+)   Anesthesia Other Findings No past medical history on file.   Reproductive/Obstetrics negative OB ROS                              Anesthesia Physical Anesthesia Plan  ASA: 2  Anesthesia Plan: General   Post-op Pain Management:    Induction: Intravenous  PONV Risk Score and Plan: 3 and Propofol  infusion, TIVA and Treatment may vary due to age or medical condition  Airway Management Planned: Natural Airway and Nasal Cannula  Additional Equipment:   Intra-op Plan:   Post-operative Plan:   Informed Consent: I have reviewed the patients History and Physical, chart, labs and discussed the procedure including the risks, benefits and alternatives for the proposed anesthesia with the patient or authorized representative who has indicated his/her understanding and acceptance.     Dental Advisory Given  Plan  Discussed with: Anesthesiologist, CRNA and Surgeon  Anesthesia Plan Comments:         Anesthesia Quick Evaluation

## 2024-05-12 NOTE — Anesthesia Postprocedure Evaluation (Signed)
 Anesthesia Post Note  Patient: Iliza Detta Mellin  Procedure(s) Performed: COLONOSCOPY EGD (ESOPHAGOGASTRODUODENOSCOPY) POLYPECTOMY, INTESTINE  Patient location during evaluation: Endoscopy Anesthesia Type: General Level of consciousness: awake and alert Pain management: pain level controlled Vital Signs Assessment: post-procedure vital signs reviewed and stable Respiratory status: spontaneous breathing, nonlabored ventilation, respiratory function stable and patient connected to nasal cannula oxygen Cardiovascular status: blood pressure returned to baseline and stable Postop Assessment: no apparent nausea or vomiting Anesthetic complications: no   No notable events documented.   Last Vitals:  Vitals:   05/12/24 1102 05/12/24 1114  BP: 126/82 121/82  Pulse: 79 69  Resp: 17 18  Temp: (!) 35.8 C (!) 35.8 C  SpO2: 100%     Last Pain:  Vitals:   05/12/24 1114  TempSrc: Temporal  PainSc: 0-No pain                 Prentice Murphy

## 2024-05-12 NOTE — Op Note (Signed)
 [facility] Patient Name: Regina Bean Procedure Date: 05/12/2024 10:17 AM MRN: [id number] Account #: [id number] Date of Birth: December 30, 1975 Admit Type: Outpatient Age: 48 Room: [facility] ENDO ROOM 4 Gender: Female Note Status: Finalized Instrument Name: Upper GI Scope [id number] Procedure:             Upper GI endoscopy Indications:           Abdominal pain, Follow-up of gastro-esophageal reflux                         disease Providers:             Ruel Kung MD, MD Referring MD:          No Local Md, MD (Referring MD) Medicines:             Monitored Anesthesia Care Complications:         No immediate complications. Procedure:             Pre-Anesthesia Assessment:                        - Prior to the procedure, a History and Physical was                         performed, and patient medications, allergies and                         sensitivities were reviewed. The patient's tolerance                         of previous anesthesia was reviewed.                        - The risks and benefits of the procedure and the                         sedation options and risks were discussed with the                         patient. All questions were answered and informed                         consent was obtained.                        - ASA Grade Assessment: II - A patient with mild                         systemic disease.                        After obtaining informed consent, the endoscope was                         passed under direct vision. Throughout the procedure,                         the patient's blood pressure, pulse, and oxygen  saturations were monitored continuously. The Endoscope                         was introduced through the mouth, and advanced to the                         third part of duodenum. The upper GI endoscopy was                         accomplished with ease. The patient tolerated the                          procedure well. Findings:      The examined duodenum was normal.      The examined esophagus was normal. Biopsies were taken with a cold       forceps for histology.      The entire examined stomach was normal. Biopsies were taken with a cold       forceps for histology.      The cardia and gastric fundus were normal on retroflexion. Impression:            - Normal examined duodenum.                        - Normal esophagus. Biopsied.                        - Normal stomach. Biopsied. Recommendation:        - Await pathology results.                        - Perform a colonoscopy today. Procedure Code(s):     --- Professional ---                        [id number], Esophagogastroduodenoscopy, flexible,                         transoral; with biopsy, single or multiple Diagnosis Code(s):     --- Professional ---                        R10.9, Unspecified abdominal pain                        K21.9, Gastro-esophageal reflux disease without                         esophagitis CPT copyright 2022 American Medical Association. All rights reserved. The codes documented in this report are preliminary and upon coder review may  be revised to meet current compliance requirements. Ruel Kung, MD Ruel Kung MD, MD 05/12/2024 10:33:19 AM This report has been signed electronically. Number of Addenda: 0 Note Initiated On: 05/12/2024 10:17 AM Estimated Blood Loss:  Estimated blood loss: none.      [facility]

## 2024-05-12 NOTE — Transfer of Care (Signed)
 Immediate Anesthesia Transfer of Care Note  Patient: Regina Bean  Procedure(s) Performed: COLONOSCOPY EGD (ESOPHAGOGASTRODUODENOSCOPY) POLYPECTOMY, INTESTINE  Patient Location: Endoscopy Unit  Anesthesia Type:General  Level of Consciousness: drowsy  Airway & Oxygen Therapy: Patient Spontanous Breathing and Patient connected to nasal cannula oxygen  Post-op Assessment: Report given to RN and Post -op Vital signs reviewed and stable  Post vital signs: Reviewed and stable  Last Vitals:  Vitals Value Taken Time  BP 123/71 05/12/24 10:51  Temp    Pulse 81 05/12/24 10:51  Resp 16 05/12/24 10:51  SpO2 100 % 05/12/24 10:51  Vitals shown include unfiled device data.  Last Pain:  Vitals:   05/12/24 0915  TempSrc: Temporal  PainSc: 0-No pain         Complications: No notable events documented.

## 2024-05-12 NOTE — Op Note (Signed)
 [facility] Gastroenterology Patient Name: Regina Bean Procedure Date: 05/12/2024 10:16 AM MRN: [id number] Account #: [id number] Date of Birth: 1975-10-21 Admit Type: Outpatient Age: 48 Room: [facility] ENDO ROOM 4 Gender: Female Note Status: Finalized Instrument Name: Colon Scope [id number] Procedure:             Colonoscopy Indications:           Screening for colorectal malignant neoplasm Providers:             Ruel Kung MD, MD Referring MD:          No Local Md, MD (Referring MD) Medicines:             Monitored Anesthesia Care Complications:         No immediate complications. Procedure:             Pre-Anesthesia Assessment:                        - Prior to the procedure, a History and Physical was                         performed, and patient medications, allergies and                         sensitivities were reviewed. The patient's tolerance                         of previous anesthesia was reviewed.                        - The risks and benefits of the procedure and the                         sedation options and risks were discussed with the                         patient. All questions were answered and informed                         consent was obtained.                        - ASA Grade Assessment: II - A patient with mild                         systemic disease.                        After obtaining informed consent, the colonoscope was                         passed under direct vision. Throughout the procedure,                         the patient's blood pressure, pulse, and oxygen                         saturations were monitored continuously. The  Colonoscope was introduced through the anus and                         advanced to the the cecum, identified by the                         appendiceal orifice. The colonoscopy was performed                         with ease. The patient tolerated the procedure  well.                         The quality of the bowel preparation was excellent.                         The ileocecal valve, appendiceal orifice, and rectum                         were photographed. Findings:      The perianal and digital rectal examinations were normal.      A 3 mm polyp was found in the cecum. The polyp was sessile. The polyp       was removed with a cold biopsy forceps. Resection and retrieval were       complete.      A 5 mm polyp was found in the ascending colon. The polyp was sessile.       The polyp was removed with a cold snare. Resection and retrieval were       complete.      The exam was otherwise without abnormality on direct and retroflexion       views. Impression:            - One 3 mm polyp in the cecum, removed with a cold                         biopsy forceps. Resected and retrieved.                        - One 5 mm polyp in the ascending colon, removed with                         a cold snare. Resected and retrieved.                        - The examination was otherwise normal on direct and                         retroflexion views. Recommendation:        - Discharge patient to home (with escort).                        - Resume previous diet.                        - Continue present medications.                        - Await pathology results.                        -  Repeat colonoscopy for surveillance based on                         pathology results. Procedure Code(s):     --- Professional ---                        [phone], Colonoscopy, flexible; with removal of                         tumor(s), polyp(s), or other lesion(s) by snare                         technique                        45380, 59, Colonoscopy, flexible; with biopsy, single                         or multiple Diagnosis Code(s):     --- Professional ---                        Z12.11, Encounter for screening for malignant neoplasm                         of colon                         D12.0, Benign neoplasm of cecum                        D12.2, Benign neoplasm of ascending colon CPT copyright 2022 American Medical Association. All rights reserved. The codes documented in this report are preliminary and upon coder review may  be revised to meet current compliance requirements. Ruel Kung, MD Ruel Kung MD, MD 05/12/2024 10:50:15 AM This report has been signed electronically. Number of Addenda: 0 Note Initiated On: 05/12/2024 10:16 AM Scope Withdrawal Time: 0 hours 10 minutes 59 seconds  Total Procedure Duration: 0 hours 13 minutes 27 seconds  Estimated Blood Loss:  Estimated blood loss: none.      [facility]

## 2024-05-12 NOTE — H&P (Addendum)
 Ruel Kung , MD [address], [address], [place], [place], [address] Phone: [phone] Fax: [phone]  Primary Care Physician:  [facility]   Pre-Procedure History & Physical: HPI:  Regina Bean is a 48 y.o. female is here for an endoscopy and colonoscopy    No past medical history on file.  No past surgical history on file.  Prior to Admission medications   Medication Sig Start Date End Date Taking? Authorizing Provider  cetirizine (ZYRTEC) 10 MG tablet Take by mouth. 07/04/13  Yes [provider]  fluticasone (FLONASE) 50 MCG/ACT nasal spray 2 sprays by Each Nare route daily. 10/13/13  Yes [provider]  naproxen  (NAPROSYN ) 500 MG tablet Take 1 tablet (500 mg total) by mouth 2 (two) times daily with a meal. 03/17/21  Yes Claudene Tanda POUR, PA-C  ondansetron  (ZOFRAN ) 4 MG tablet Take 1 tablet (4 mg total) by mouth every 8 (eight) hours as needed for vomiting or nausea. 11/05/21  Yes Floy Roberts, MD  orphenadrine  (NORFLEX ) 100 MG tablet Take 1 tablet (100 mg total) by mouth 2 (two) times daily. 03/17/21  Yes Claudene Tanda POUR, PA-C  pantoprazole (PROTONIX) 20 MG tablet Take by mouth.   Yes [provider]  sucralfate (CARAFATE) 1 g tablet Take 1 g by mouth 4 (four) times daily -  with meals and at bedtime.   Yes [provider]    Allergies as of 05/02/2024   (No Known Allergies)    No family history on file.  Social History   Socioeconomic History   Marital status: Married    Spouse name: Not on file   Number of children: Not on file   Years of education: Not on file   Highest education level: Not on file  Occupational History   Not on file  Tobacco Use   Smoking status: Never   Smokeless tobacco: Never  Substance and Sexual Activity   Alcohol use: Never   Drug use: Never   Sexual activity: Not on file  Other Topics Concern   Not on file  Social History Narrative   Not on file    Social Drivers of Health   Financial Resource Strain: Medium Risk (04/28/2024)   Received from [facility]   Overall Financial Resource Strain (CARDIA)    Difficulty of Paying Living Expenses: Somewhat hard  Food Insecurity: Food Insecurity Present (04/28/2024)   Received from [facility]   Hunger Vital Sign    Within the past 12 months, you worried that your food would run out before you got the money to buy more.: Sometimes true    Within the past 12 months, the food you bought just didn't last and you didn't have money to get more.: Sometimes true  Transportation Needs: No Transportation Needs (04/28/2024)   Received from [facility] - Transportation    In the past 12 months, has lack of transportation kept you from medical appointments or from getting medications?: No    Lack of Transportation (Non-Medical): No  Physical Activity: Not on file  Stress: Not on file  Social Connections: Not on file  Intimate Partner Violence: Not on file    Review of Systems: See HPI, otherwise negative ROS  Physical Exam: BP (!) 142/76   Pulse 82   Temp (!) 96.7 F (35.9 C) (Temporal)   Resp 20   Ht 5' 4 (1.626 m)   Wt 59.3  kg   SpO2 97%   BMI 22.45 kg/m  General:   Alert,  pleasant and cooperative in NAD Head:  Normocephalic and atraumatic. Neck:  Supple; no masses or thyromegaly. Lungs:  Clear throughout to auscultation, normal respiratory effort.    Heart:  +S1, +S2, Regular rate and rhythm, No edema. Abdomen:  Soft, nontender and nondistended. Normal bowel sounds, without guarding, and without rebound.   Neurologic:  Alert and  oriented x4;  grossly normal neurologically.  Impression/Plan: Regina Bean is here for an endoscopy and colonoscopy  to be performed for  evaluation of abdominal pain , GERD and colon cancer screening    Risks, benefits, limitations, and alternatives regarding endoscopy have  been reviewed with the patient via interpretor.  Questions have been answered.  All parties agreeable.   Ruel Kung, MD  05/12/2024, 9:37 AM

## 2024-05-15 LAB — SURGICAL PATHOLOGY

## 2024-05-26 ENCOUNTER — Ambulatory Visit: Payer: Self-pay | Admitting: Gastroenterology

## 2024-05-29 ENCOUNTER — Emergency Department

## 2024-05-29 ENCOUNTER — Emergency Department
Admission: EM | Admit: 2024-05-29 | Discharge: 2024-05-29 | Disposition: A | Discharge status: Home / Self Care | Payer: Worker's Compensation | Attending: Emergency Medicine | Admitting: Emergency Medicine

## 2024-05-29 ENCOUNTER — Encounter: Payer: Self-pay | Admitting: Emergency Medicine

## 2024-05-29 ENCOUNTER — Other Ambulatory Visit: Payer: Self-pay

## 2024-05-29 DIAGNOSIS — S0083XA Contusion of other part of head, initial encounter: Secondary | ICD-10-CM | POA: Insufficient documentation

## 2024-05-29 DIAGNOSIS — S0093XA Contusion of unspecified part of head, initial encounter: Secondary | ICD-10-CM

## 2024-05-29 DIAGNOSIS — Y990 Civilian activity done for income or pay: Secondary | ICD-10-CM | POA: Diagnosis not present

## 2024-05-29 DIAGNOSIS — W228XXA Striking against or struck by other objects, initial encounter: Secondary | ICD-10-CM | POA: Insufficient documentation

## 2024-05-29 DIAGNOSIS — S0990XA Unspecified injury of head, initial encounter: Secondary | ICD-10-CM | POA: Diagnosis present

## 2024-05-29 MED ORDER — MORPHINE SULFATE (PF) 2 MG/ML IV SOLN: 2.0000 mg | Freq: Once | INTRAVENOUS | Status: DC

## 2024-05-29 NOTE — ED Triage Notes (Signed)
 Pt was testing a brass part at work on Friday that failed and flew up and came back down and hit her on the left posterior occiput. Pt seen by occupational health for follow up today and was sent here due to visual changes. Pt says she was seeing 2 black spots this morning when she stood up to go to the bathroom. She reports it has resolved at this time. It lasted for about 30 minutes yesterday morning and this morning. Pt reports vision is clear during triage.

## 2024-05-29 NOTE — ED Notes (Signed)
 See triage note  States she was hit in the head with a brass part  States is flew off and hit her in the head   States now she is see spots  This started yesterday  Vision is better now Was sent over from occupational health

## 2024-05-29 NOTE — Discharge Instructions (Signed)
 Follow-up with occupational health at [facility] if any continued problems.  Your CT scan is negative for head injury or cervical injury.  Continue with Tylenol  and ibuprofen as needed.

## 2024-05-29 NOTE — ED Provider Notes (Signed)
 "  [facility] Provider Note    Event Date/Time   First MD Initiated Contact with Patient 05/29/24 [phone]     (approximate)   History   Head Injury   HPI Per video Spanish interpreter Regina Bean is a 48 y.o. female   is brought to the ED today with complaint of headache and some visual changes when she first woke up this morning.  Patient states that on Friday she was testing a part that was under pressure and that it basically blew off the pressure valve flying up to the ceiling and coming back striking her on the head.  There was no loss of consciousness at that time.  She was seen by a medical facility who told her that she needed to take Tylenol  and ibuprofen which she has over the weekend.  She states that since being in the emergency department she has had no continued visual changes.  Patient has a history of seasonal allergies.      Physical Exam   Triage Vital Signs: ED Triage Vitals  Encounter Vitals Group     BP 05/29/24 0925 (!) 155/80     Girls Systolic BP Percentile --      Girls Diastolic BP Percentile --      Boys Systolic BP Percentile --      Boys Diastolic BP Percentile --      Pulse Rate 05/29/24 0925 75     Resp 05/29/24 0925 17     Temp 05/29/24 0925 98.7 F (37.1 C)     Temp Source 05/29/24 0925 Oral     SpO2 05/29/24 0925 100 %     Weight 05/29/24 0929 133 lb (60.3 kg)     Height 05/29/24 0929 5' 4 (1.626 m)     Head Circumference --      Peak Flow --      Pain Score 05/29/24 0925 0     Pain Loc --      Pain Education --      Exclude from Growth Chart --     Most recent vital signs: Vitals:   05/29/24 0925  BP: (!) 155/80  Pulse: 75  Resp: 17  Temp: 98.7 F (37.1 C)  SpO2: 100%     General: Awake, no distress.  Alert, talkative, able to answer questions appropriately. CV:  Good peripheral perfusion.  Heart rate rate and rhythm. Resp:  Normal effort.  Lungs are clear bilaterally. Abd:  No  distention.  Other:  PERRLA, EOMI's, cranial nerves II through XII grossly intact.  Patient is able to stand and ambulate without any assistance.  Good muscle strength bilaterally.  Nontender cervical spine posteriorly on palpation.  There is a moderate soft tissue tenderness on the lateral posterior aspect of her scalp.  Skin is intact.   ED Results / Procedures / Treatments   Labs (all labs ordered are listed, but only abnormal results are displayed) Labs Reviewed - No data to display   RADIOLOGY  CT head and cervical spine per radiology is negative for acute intracranial findings and also cervical spine was negative for fracture or subluxation.   PROCEDURES:  Critical Care performed:   Procedures   MEDICATIONS ORDERED IN ED: Medications - No data to display   IMPRESSION / MDM / ASSESSMENT AND PLAN / ED COURSE  I reviewed the triage vital signs and the nursing notes.   Differential diagnosis includes, but is not limited to, posttraumatic headache, concussion, head injury,  cervical strain, cervical fracture, muscle skeletal pain.  48 year old female presents to the ED with complaint of headache after being hit by a piece of equipment at work on Friday.  This morning she woke up and had a headache after standing without any nausea or vomiting.  She is concerned for the headache and also some limited black spots that she temporarily saw and now has resolved.  Video Spanish interpreter was used and patient was made aware that her CT head and cervical spine were negative.  She is reassured and told to continue with Tylenol  or ibuprofen as needed.  Patient was offered a note to remain out of work today however she states that she plans to go back to work today.  She also will follow-up with occupational health if any continued problems or concerns.      Patient's presentation is most consistent with acute illness / injury with system symptoms.  FINAL CLINICAL IMPRESSION(S) / ED  DIAGNOSES   Final diagnoses:  Contusion of head, initial encounter     Rx / DC Orders   ED Discharge Orders     None        Note:  This document was prepared using Dragon voice recognition software and may include unintentional dictation errors.   Saunders Shona CROME, PA-C 05/29/24 1610    Arlander Charleston, MD 06/01/24 [phone]  "
